# Patient Record
Sex: Female | Born: 1985 | Race: White | Hispanic: No | Marital: Single | Smoking: Current every day smoker
Health system: Southern US, Community
[De-identification: ages and names within clinical notes are randomized; demographics above are authoritative.]

---

## 2017-02-27 ENCOUNTER — Other Ambulatory Visit: Payer: Self-pay

## 2017-02-27 ENCOUNTER — Encounter (HOSPITAL_BASED_OUTPATIENT_CLINIC_OR_DEPARTMENT_OTHER): Payer: Self-pay

## 2017-02-27 ENCOUNTER — Emergency Department (HOSPITAL_BASED_OUTPATIENT_CLINIC_OR_DEPARTMENT_OTHER): Payer: Self-pay

## 2017-02-27 ENCOUNTER — Emergency Department (HOSPITAL_BASED_OUTPATIENT_CLINIC_OR_DEPARTMENT_OTHER)
Admission: EM | Admit: 2017-02-27 | Discharge: 2017-02-28 | Disposition: A | Discharge status: Home / Self Care | Payer: Self-pay | Attending: Emergency Medicine | Admitting: Emergency Medicine

## 2017-02-27 DIAGNOSIS — J209 Acute bronchitis, unspecified: Secondary | ICD-10-CM | POA: Insufficient documentation

## 2017-02-27 DIAGNOSIS — F17200 Nicotine dependence, unspecified, uncomplicated: Secondary | ICD-10-CM | POA: Insufficient documentation

## 2017-02-27 MED ORDER — IPRATROPIUM-ALBUTEROL 0.5-2.5 (3) MG/3ML IN SOLN
3.0000 mL | Freq: Once | RESPIRATORY_TRACT | Status: AC
  Administered 2017-02-27: 3 mL via RESPIRATORY_TRACT
  Filled 2017-02-27: qty 3

## 2017-02-27 MED ORDER — ALBUTEROL SULFATE (2.5 MG/3ML) 0.083% IN NEBU
2.5000 mg | INHALATION_SOLUTION | Freq: Once | RESPIRATORY_TRACT | Status: AC
  Administered 2017-02-27: 2.5 mg via RESPIRATORY_TRACT
  Filled 2017-02-27: qty 3

## 2017-02-27 NOTE — ED Triage Notes (Addendum)
Cough and congestion for two weeks with body aches and epigastric pain, subjective fever

## 2017-02-28 MED ORDER — AZITHROMYCIN 250 MG PO TABS: ORAL_TABLET | ORAL | 0 refills | Status: AC

## 2017-02-28 MED ORDER — ALBUTEROL SULFATE HFA 108 (90 BASE) MCG/ACT IN AERS
2.0000 | INHALATION_SPRAY | RESPIRATORY_TRACT | Status: DC | PRN
  Administered 2017-02-28: 2 via RESPIRATORY_TRACT
  Filled 2017-02-28: qty 6.7

## 2017-02-28 NOTE — ED Notes (Signed)
Pt verbalizes understanding of d/c instructions and denies any further needs at this time. 

## 2017-02-28 NOTE — ED Provider Notes (Signed)
[facility] Provider Note: Lowella DellJ. Lane Pilar Westergaard, MD, FACEP  CSN: [id number] MRN: [id number] ARRIVAL: 02/27/17 at 2005 ROOM: [facility]   CHIEF COMPLAINT  Cough   HISTORY OF PRESENT ILLNESS  02/28/17 1:33 AM Karen Price is a 31 y.o. female with a 2-week history of productive cough, shortness of breath and wheezing.  Symptoms have been worse in the mornings.  She has had a subjective fever.  She denies nausea, vomiting or diarrhea.  There is associated epigastric burning, worse with cough.  She rates this is a 7 out of 10.  She was evaluated by respiratory therapy and given albuterol and Atrovent via neb treatment with significant improvement in her symptomatology.   History reviewed. No pertinent past medical history.  History reviewed. No pertinent surgical history.  No family history on file.  Social History   Tobacco Use  . Smoking status: Current Every Day Smoker    Packs/day: 1.00  . Smokeless tobacco: Never Used  Substance Use Topics  . Alcohol use: Yes    Comment: occasional  . Drug use: Not on file    Prior to Admission medications   Not on File    Allergies Patient has no known allergies.   REVIEW OF SYSTEMS  Negative except as noted here or in the History of Present Illness.   PHYSICAL EXAMINATION  Initial Vital Signs Blood pressure 121/60, pulse 66, temperature 98.6 F (37 C), temperature source Oral, resp. rate 18, height 5\' 3"  (1.6 m), weight 46.7 kg (103 lb), last menstrual period 02/05/2017, SpO2 99 %.  Examination General: Well-developed, thin female in no acute distress; appearance consistent with age of record HENT: normocephalic; atraumatic Eyes: pupils equal, round and reactive to light; extraocular muscles intact Neck: supple Heart: regular rate and rhythm Lungs: clear to auscultation bilaterally; rattly cough Abdomen: soft; nondistended; nontender; no masses or hepatosplenomegaly; bowel sounds present Extremities: No  deformity; full range of motion; pulses normal Neurologic: Awake, alert and oriented; motor function intact in all extremities and symmetric; no facial droop Skin: Warm and dry Psychiatric: Normal mood and affect   RESULTS  Summary of this visit's results, reviewed by myself:   EKG Interpretation  Date/Time:    Ventricular Rate:    PR Interval:    QRS Duration:   QT Interval:    QTC Calculation:   R Axis:     Text Interpretation:        Laboratory Studies: No results found for this or any previous visit (from the past 24 hour(s)). Imaging Studies: Dg Chest 2 View  Result Date: 02/27/2017 CLINICAL DATA:  Cough with congestion. EXAM: CHEST  2 VIEW COMPARISON:  None. FINDINGS: The heart size and mediastinal contours are within normal limits. The lungs are hyperinflated without pneumonic consolidation, CHF or effusion. The visualized skeletal structures are unremarkable. IMPRESSION: No active cardiopulmonary disease.  Hyperinflated lungs. Electronically Signed   By: Tollie Ethavid  Kwon M.D.   On: 02/27/2017 20:49    ED COURSE  Nursing notes and initial vitals signs, including pulse oximetry, reviewed.  Vitals:   02/27/17 2025 02/27/17 2027 02/27/17 2330 02/28/17 0127  BP: 101/68   121/60  Pulse: 81   66  Resp: 18   18  Temp: 98.6 F (37 C)     TempSrc: Oral     SpO2: 98%  97% 99%  Weight:  46.7 kg (103 lb)    Height:  5\' 3"  (1.6 m)      PROCEDURES    ED DIAGNOSES  ICD-10-CM   1. Acute bronchitis with bronchospasm J20.9        Latrina Guttman, MD 02/28/17 [phone]

## 2018-12-07 IMAGING — DX DG CHEST 2V
2 series · 2 of 2 positions shown · non-contrast
Comparison: None.

CLINICAL DATA: Cough with congestion.

EXAM:
CHEST  2 VIEW

[chest pa]
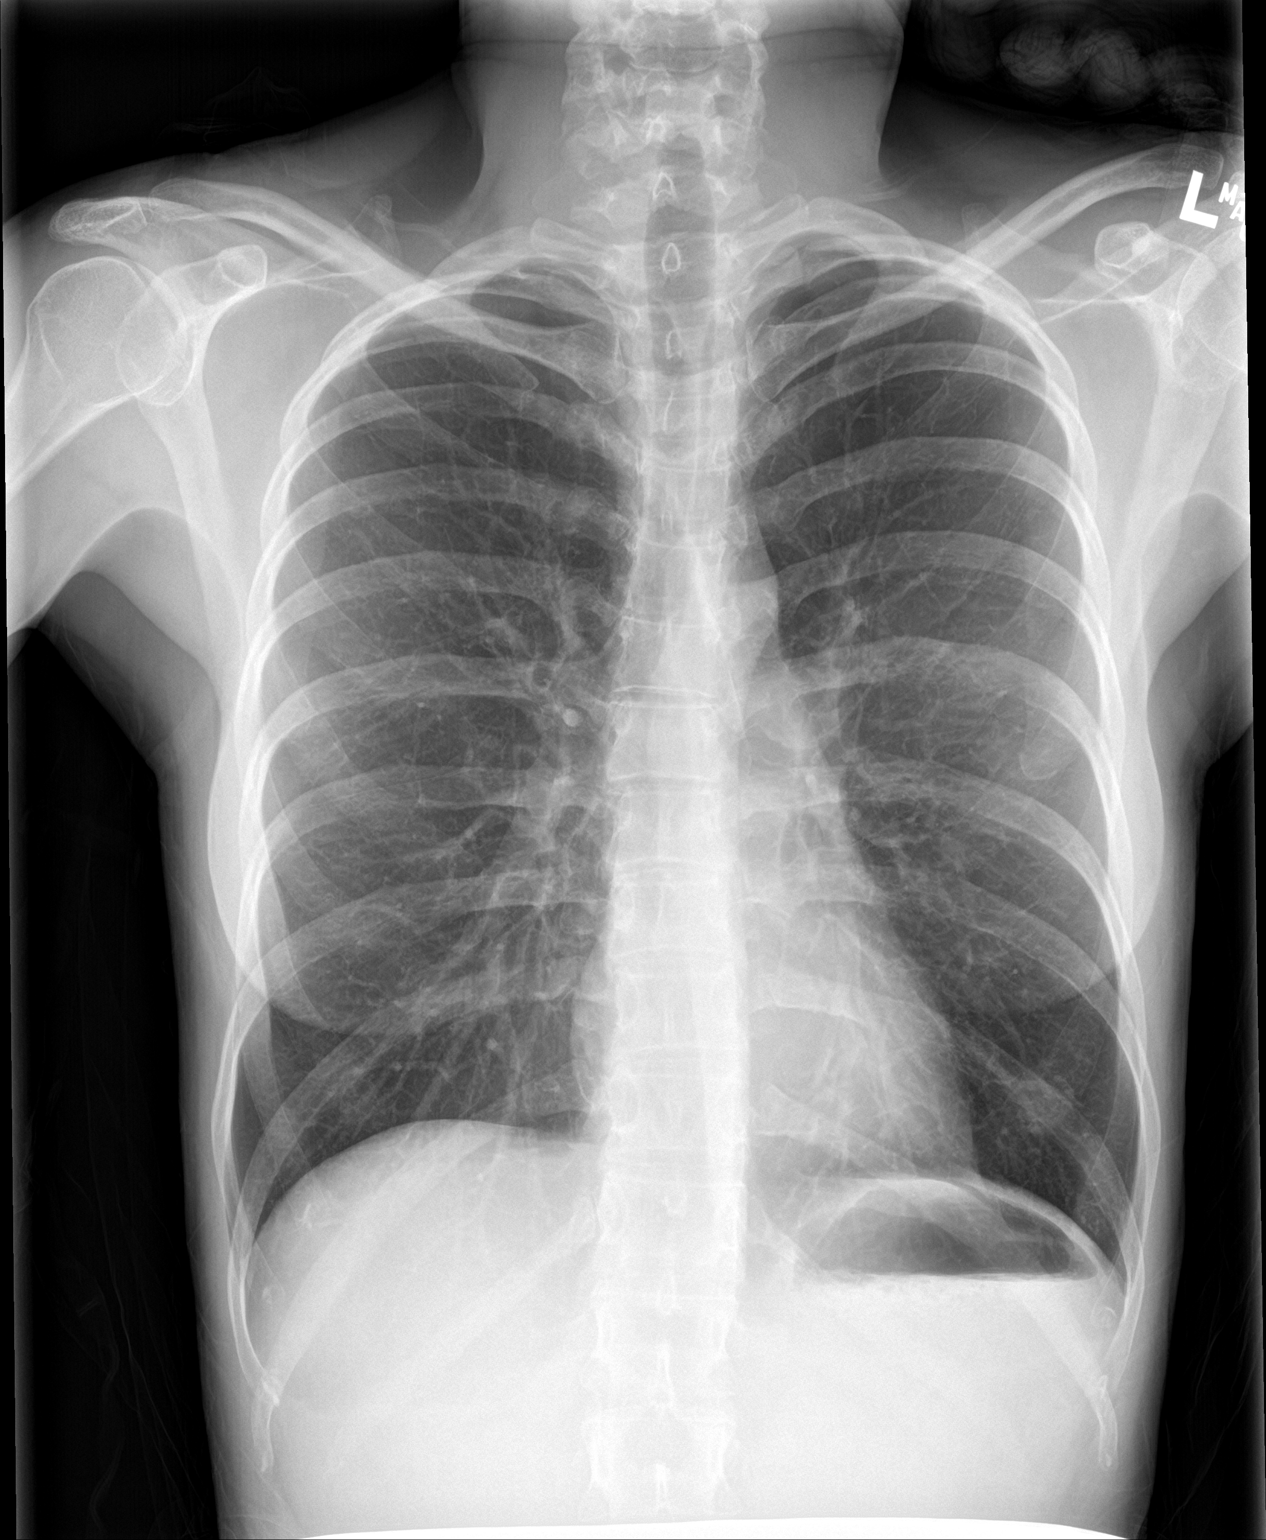

[chest lat]
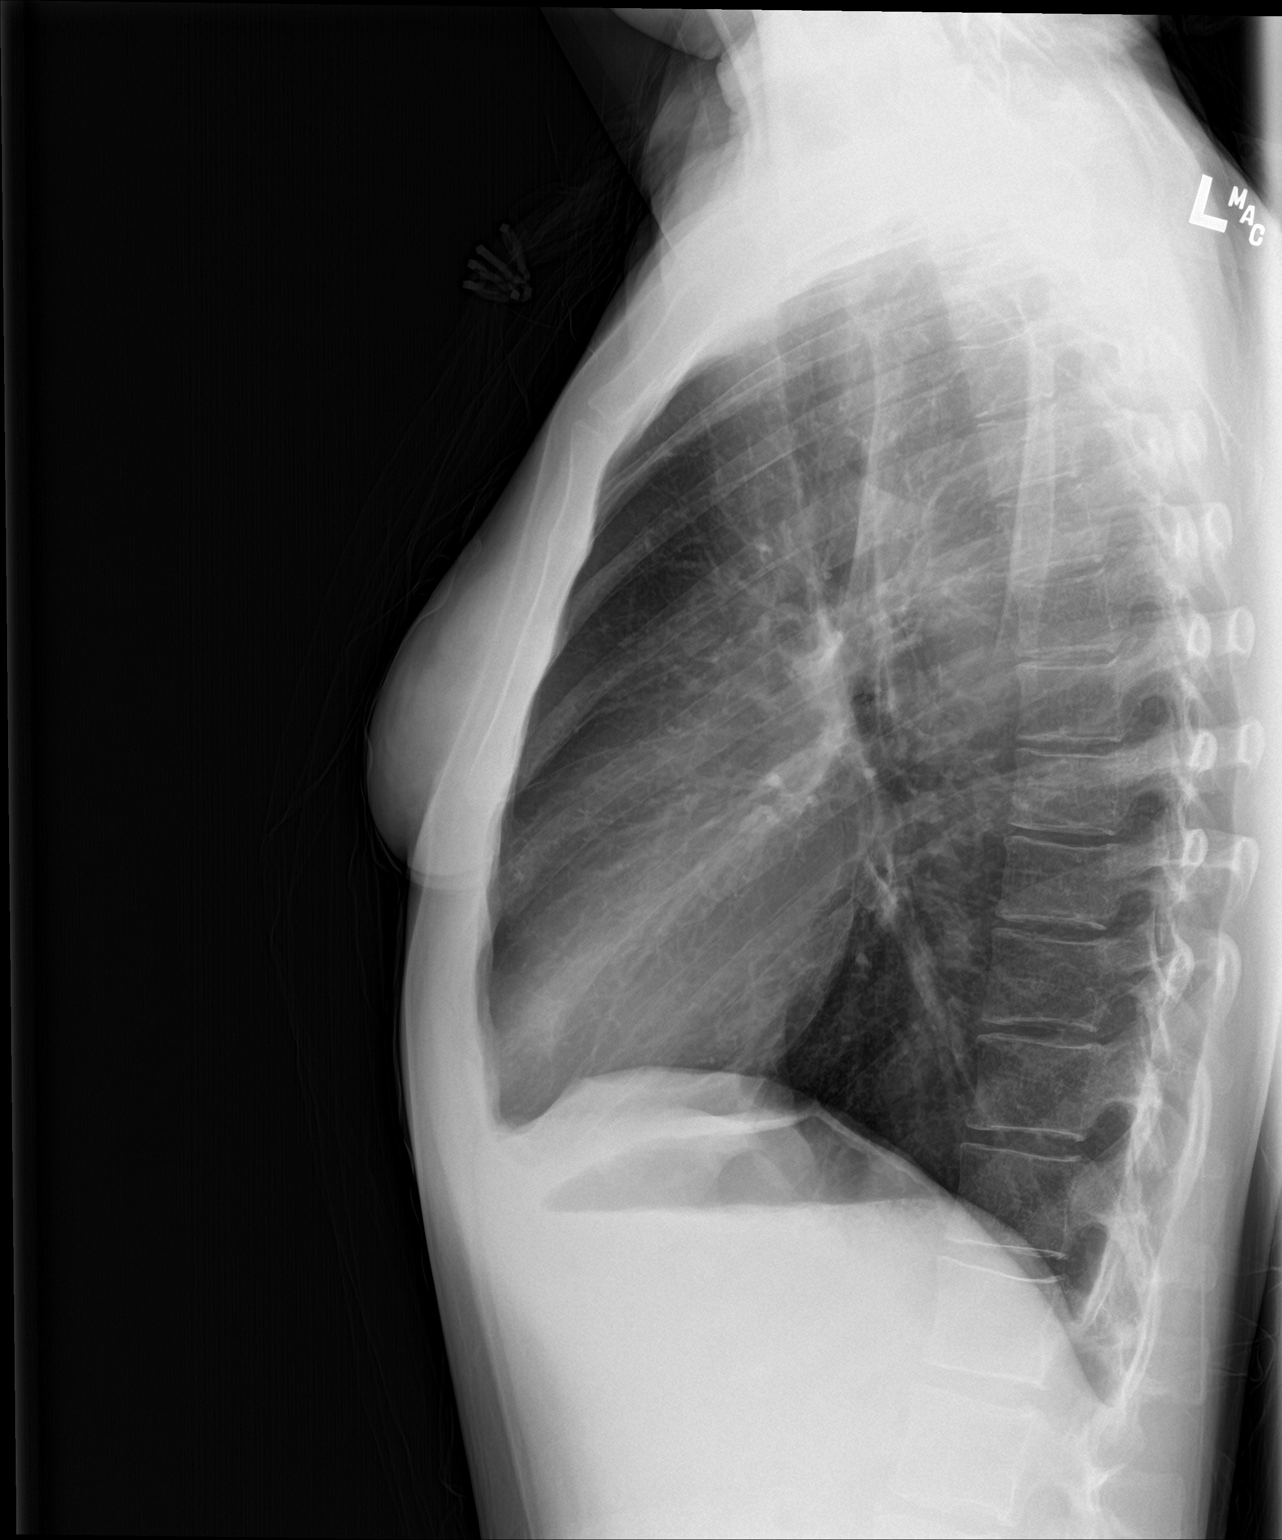

[2 of 2 positions shown; findings below may reference images not displayed]

FINDINGS: The heart size and mediastinal contours are within normal limits.
The lungs are hyperinflated without pneumonic consolidation, CHF or
effusion. The visualized skeletal structures are unremarkable.
IMPRESSION: No active cardiopulmonary disease.  Hyperinflated lungs.
